# Patient Record
Sex: Male | Born: 1990 | Race: White | Hispanic: No | Marital: Single
Health system: Southern US, Community
[De-identification: ages and names within clinical notes are randomized; demographics above are authoritative.]

---

## 2018-03-29 DIAGNOSIS — M25461 Effusion, right knee: Secondary | ICD-10-CM | POA: Diagnosis not present

## 2018-03-29 DIAGNOSIS — L4050 Arthropathic psoriasis, unspecified: Secondary | ICD-10-CM | POA: Diagnosis not present

## 2018-03-29 DIAGNOSIS — Z6826 Body mass index (BMI) 26.0-26.9, adult: Secondary | ICD-10-CM | POA: Diagnosis not present

## 2018-03-29 DIAGNOSIS — Z1331 Encounter for screening for depression: Secondary | ICD-10-CM | POA: Diagnosis not present

## 2018-04-17 DIAGNOSIS — M25561 Pain in right knee: Secondary | ICD-10-CM | POA: Diagnosis not present

## 2018-04-17 DIAGNOSIS — L4050 Arthropathic psoriasis, unspecified: Secondary | ICD-10-CM | POA: Diagnosis not present

## 2018-04-17 DIAGNOSIS — R5383 Other fatigue: Secondary | ICD-10-CM | POA: Diagnosis not present

## 2018-04-17 DIAGNOSIS — M25541 Pain in joints of right hand: Secondary | ICD-10-CM | POA: Diagnosis not present

## 2018-04-17 DIAGNOSIS — Z79899 Other long term (current) drug therapy: Secondary | ICD-10-CM | POA: Diagnosis not present

## 2018-04-17 DIAGNOSIS — L409 Psoriasis, unspecified: Secondary | ICD-10-CM | POA: Diagnosis not present

## 2018-04-17 DIAGNOSIS — M545 Low back pain: Secondary | ICD-10-CM | POA: Diagnosis not present

## 2018-04-17 DIAGNOSIS — M2560 Stiffness of unspecified joint, not elsewhere classified: Secondary | ICD-10-CM | POA: Diagnosis not present

## 2018-04-17 DIAGNOSIS — M1990 Unspecified osteoarthritis, unspecified site: Secondary | ICD-10-CM | POA: Diagnosis not present

## 2018-10-16 DIAGNOSIS — M545 Low back pain: Secondary | ICD-10-CM | POA: Diagnosis not present

## 2018-10-16 DIAGNOSIS — Z79899 Other long term (current) drug therapy: Secondary | ICD-10-CM | POA: Diagnosis not present

## 2018-10-16 DIAGNOSIS — M25541 Pain in joints of right hand: Secondary | ICD-10-CM | POA: Diagnosis not present

## 2018-10-16 DIAGNOSIS — M2560 Stiffness of unspecified joint, not elsewhere classified: Secondary | ICD-10-CM | POA: Diagnosis not present

## 2018-10-16 DIAGNOSIS — L4050 Arthropathic psoriasis, unspecified: Secondary | ICD-10-CM | POA: Diagnosis not present

## 2018-10-16 DIAGNOSIS — M1990 Unspecified osteoarthritis, unspecified site: Secondary | ICD-10-CM | POA: Diagnosis not present

## 2018-10-16 DIAGNOSIS — M659 Synovitis and tenosynovitis, unspecified: Secondary | ICD-10-CM | POA: Diagnosis not present

## 2018-10-16 DIAGNOSIS — R5383 Other fatigue: Secondary | ICD-10-CM | POA: Diagnosis not present

## 2018-10-16 DIAGNOSIS — L409 Psoriasis, unspecified: Secondary | ICD-10-CM | POA: Diagnosis not present

## 2018-10-16 DIAGNOSIS — M25561 Pain in right knee: Secondary | ICD-10-CM | POA: Diagnosis not present

## 2018-11-12 ENCOUNTER — Encounter: Payer: Self-pay | Admitting: Pharmacist

## 2018-11-12 ENCOUNTER — Telehealth: Payer: Self-pay | Admitting: Pharmacist

## 2018-11-12 ENCOUNTER — Other Ambulatory Visit: Payer: Self-pay

## 2018-11-12 DIAGNOSIS — Z79899 Other long term (current) drug therapy: Secondary | ICD-10-CM

## 2018-11-12 MED ORDER — ADALIMUMAB 40 MG/0.8ML ~~LOC~~ AJKT: 40.0000 mg | AUTO-INJECTOR | SUBCUTANEOUS | 1 refills | Status: DC

## 2018-11-12 NOTE — Progress Notes (Signed)
  S: Patient presents for review of their specialty medication therapy.  Patient is currently taking Humira for psoriatic arthritis. Patient is managed by Dr. Hector Shade for this.   Adherence: denies any missed doses. Has had to stop in the past due to lack of coverage by insurance and concern of increased infection risk.   Efficacy: worked well for him in the past.  Dosing:  Psoriatic arthritis: SubQ: 40 mg every other week (may continue methotrexate, other nonbiologic DMARDS, corticosteroids, NSAIDs and/or analgesics)  Dose adjustments: Renal: no dose adjustments (has not been studied) Hepatic: no dose adjustments (has not been studied)  Screening: TB test: negative per patient Hepatitis: negative  Monitoring: S/sx of infection: denies CBC: see CareEverywhere S/sx of hypersensitivity: denies S/sx of malignancy: denies S/sx of heart failure: denies  Other side effects: denies  O:     No results found for: WBC, HGB, HCT, MCV, PLT    Chemistry   No results found for: NA, K, CL, CO2, BUN, CREATININE, GLU No results found for: CALCIUM, ALKPHOS, AST, ALT, BILITOT     A/P: 1. Medication review: Patient currently on Humira for psoriatic arthritis. Reviewed the medication with the patient, including the following: Humira is a TNF blocking agent indicated for ankylosing spondylitis, Crohn's disease, Hidradenitis suppurativa, psoriatic arthritis, plaque psoriasis, ulcerative colitis, and uveitis. Patient educated on purpose, proper use and potential adverse effects of Humira. Possible adverse effects are increased risk of infections, headache, and injection site reactions. There is the possibility of an increased risk of malignancy but it is not well understood if this increased risk is due to there medication or the disease state. There are rare cases of pancytopenia and aplastic anemia. For SubQ injection at separate sites in the thigh or lower abdomen (avoiding areas within 2 inches  of navel); rotate injection sites. May leave at room temperature for ~15 to 30 minutes prior to use; do not remove cap or cover while allowing product to reach room temperature. Do not use if solution is discolored or contains particulate matter. Do not administer to skin which is red, tender, bruised, hard, or that has scars, stretch marks, or psoriasis plaques. Needle cap of the prefilled syringe or needle cover for the adalimumab pen may contain latex. Prefilled pens and syringes are available for use by patients and the full amount of the syringe should be injected (self-administration); the vial is intended for institutional use only. Vials do not contain a preservative; discard unused portion. No recommendations for any changes at this time.  Alvino Blood, PharmD, BCPS, BCACP, CPP Clinical Pharmacist Practitioner  [phone]

## 2018-11-16 MED FILL — HUMIRA PEN 40 MG/0.8ML PNKT: 40 | 28 days supply | Qty: 2 | Fill #0

## 2019-07-24 MED FILL — HUMIRA PEN 40 MG/0.8ML PNKT: 40 | 28 days supply | Qty: 2 | Fill #1

## 2019-08-29 MED FILL — HUMIRA PEN 40 MG/0.8ML PNKT: 40 | 28 days supply | Qty: 2 | Fill #2

## 2019-12-04 ENCOUNTER — Telehealth: Payer: Self-pay | Admitting: Pharmacist

## 2019-12-04 NOTE — Telephone Encounter (Signed)
Called patient to schedule an appointment for the [facility]. I was unable to reach the patient so I left a HIPAA-compliant message requesting that the patient return my call.   

## 2020-01-04 ENCOUNTER — Emergency Department: Payer: PRIVATE HEALTH INSURANCE

## 2020-01-04 ENCOUNTER — Encounter: Payer: Self-pay | Admitting: Emergency Medicine

## 2020-01-04 ENCOUNTER — Other Ambulatory Visit: Payer: Self-pay

## 2020-01-04 ENCOUNTER — Emergency Department
Admission: EM | Admit: 2020-01-04 | Discharge: 2020-01-04 | Disposition: A | Discharge status: Home / Self Care | Payer: PRIVATE HEALTH INSURANCE | Attending: Emergency Medicine | Admitting: Emergency Medicine

## 2020-01-04 DIAGNOSIS — Y92238 Other place in hospital as the place of occurrence of the external cause: Secondary | ICD-10-CM | POA: Insufficient documentation

## 2020-01-04 DIAGNOSIS — Y9389 Activity, other specified: Secondary | ICD-10-CM | POA: Insufficient documentation

## 2020-01-04 DIAGNOSIS — Z79899 Other long term (current) drug therapy: Secondary | ICD-10-CM | POA: Insufficient documentation

## 2020-01-04 DIAGNOSIS — S0083XA Contusion of other part of head, initial encounter: Secondary | ICD-10-CM | POA: Insufficient documentation

## 2020-01-04 DIAGNOSIS — S0993XA Unspecified injury of face, initial encounter: Secondary | ICD-10-CM | POA: Diagnosis present

## 2020-01-04 DIAGNOSIS — S40022A Contusion of left upper arm, initial encounter: Secondary | ICD-10-CM | POA: Diagnosis not present

## 2020-01-04 DIAGNOSIS — Y990 Civilian activity done for income or pay: Secondary | ICD-10-CM | POA: Insufficient documentation
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
## 2020-03-14 DIAGNOSIS — F3289 Other specified depressive episodes: Secondary | ICD-10-CM | POA: Diagnosis not present

## 2020-03-14 DIAGNOSIS — F1010 Alcohol abuse, uncomplicated: Secondary | ICD-10-CM | POA: Diagnosis not present

## 2020-03-14 DIAGNOSIS — M25561 Pain in right knee: Secondary | ICD-10-CM | POA: Diagnosis not present

## 2020-03-14 DIAGNOSIS — G8929 Other chronic pain: Secondary | ICD-10-CM | POA: Diagnosis not present

## 2020-03-14 DIAGNOSIS — M25562 Pain in left knee: Secondary | ICD-10-CM | POA: Diagnosis not present

## 2020-03-21 DIAGNOSIS — M25579 Pain in unspecified ankle and joints of unspecified foot: Secondary | ICD-10-CM | POA: Diagnosis not present

## 2020-03-21 DIAGNOSIS — M79671 Pain in right foot: Secondary | ICD-10-CM | POA: Diagnosis not present

## 2020-03-26 DIAGNOSIS — M25541 Pain in joints of right hand: Secondary | ICD-10-CM | POA: Diagnosis not present

## 2020-03-26 DIAGNOSIS — M25462 Effusion, left knee: Secondary | ICD-10-CM | POA: Diagnosis not present

## 2020-03-26 DIAGNOSIS — M533 Sacrococcygeal disorders, not elsewhere classified: Secondary | ICD-10-CM | POA: Diagnosis not present

## 2020-03-26 DIAGNOSIS — M2560 Stiffness of unspecified joint, not elsewhere classified: Secondary | ICD-10-CM | POA: Diagnosis not present

## 2020-03-26 DIAGNOSIS — M25562 Pain in left knee: Secondary | ICD-10-CM | POA: Diagnosis not present

## 2020-03-26 DIAGNOSIS — M79642 Pain in left hand: Secondary | ICD-10-CM | POA: Diagnosis not present

## 2020-03-26 DIAGNOSIS — M1990 Unspecified osteoarthritis, unspecified site: Secondary | ICD-10-CM | POA: Diagnosis not present

## 2020-03-26 DIAGNOSIS — M25461 Effusion, right knee: Secondary | ICD-10-CM | POA: Diagnosis not present

## 2020-03-26 DIAGNOSIS — Z79899 Other long term (current) drug therapy: Secondary | ICD-10-CM | POA: Diagnosis not present

## 2020-03-26 DIAGNOSIS — M79641 Pain in right hand: Secondary | ICD-10-CM | POA: Diagnosis not present

## 2020-03-26 DIAGNOSIS — L409 Psoriasis, unspecified: Secondary | ICD-10-CM | POA: Diagnosis not present

## 2020-03-26 DIAGNOSIS — L4050 Arthropathic psoriasis, unspecified: Secondary | ICD-10-CM | POA: Diagnosis not present

## 2020-03-26 DIAGNOSIS — M545 Low back pain, unspecified: Secondary | ICD-10-CM | POA: Diagnosis not present

## 2020-03-26 DIAGNOSIS — R5383 Other fatigue: Secondary | ICD-10-CM | POA: Diagnosis not present

## 2020-03-26 DIAGNOSIS — M25561 Pain in right knee: Secondary | ICD-10-CM | POA: Diagnosis not present

## 2020-04-01 ENCOUNTER — Ambulatory Visit (HOSPITAL_BASED_OUTPATIENT_CLINIC_OR_DEPARTMENT_OTHER): Payer: Self-pay | Admitting: Pharmacist

## 2020-04-01 ENCOUNTER — Other Ambulatory Visit: Payer: Self-pay | Admitting: Internal Medicine

## 2020-04-01 ENCOUNTER — Other Ambulatory Visit: Payer: Self-pay

## 2020-04-01 DIAGNOSIS — Z79899 Other long term (current) drug therapy: Secondary | ICD-10-CM

## 2020-04-01 MED ORDER — HUMIRA PEN 40 MG/0.8ML ~~LOC~~ PNKT: PEN_INJECTOR | SUBCUTANEOUS | 2 refills | Status: DC

## 2020-04-01 NOTE — Progress Notes (Signed)
°  S: Patient presents for review of their specialty medication therapy.  Patient is currently taking Humira for psoriatic arthritis. Patient is managed by Dr. Hector Shade for this.   Adherence: has been off the medication for some time d/t concern of increased infection risk with his job. Also had some issues with refills with the pharmacy.     Efficacy: worked well for him in the past.  Dosing:  Psoriatic arthritis: SubQ: 40 mg every other week (may continue methotrexate, other nonbiologic DMARDS, corticosteroids, NSAIDs and/or analgesics)  Dose adjustments: Renal: no dose adjustments (has not been studied) Hepatic: no dose adjustments (has not been studied)  Screening: TB test: negative per patient Hepatitis: negative  Monitoring: S/sx of infection: denies CBC: see CareEverywhere S/sx of hypersensitivity: denies S/sx of malignancy: denies S/sx of heart failure: denies  Other side effects: denies  O:     No results found for: WBC, HGB, HCT, MCV, PLT    Chemistry   No results found for: NA, K, CL, CO2, BUN, CREATININE, GLU No results found for: CALCIUM, ALKPHOS, AST, ALT, BILITOT     A/P: 1. Medication review: Patient restarting Humira for psoriatic arthritis. Reviewed the medication with the patient, including the following: Humira is a TNF blocking agent indicated for ankylosing spondylitis, Crohn's disease, Hidradenitis suppurativa, psoriatic arthritis, plaque psoriasis, ulcerative colitis, and uveitis. Patient educated on purpose, proper use and potential adverse effects of Humira. Possible adverse effects are increased risk of infections, headache, and injection site reactions. There is the possibility of an increased risk of malignancy but it is not well understood if this increased risk is due to there medication or the disease state. There are rare cases of pancytopenia and aplastic anemia. For SubQ injection at separate sites in the thigh or lower abdomen (avoiding  areas within 2 inches of navel); rotate injection sites. May leave at room temperature for ~15 to 30 minutes prior to use; do not remove cap or cover while allowing product to reach room temperature. Do not use if solution is discolored or contains particulate matter. Do not administer to skin which is red, tender, bruised, hard, or that has scars, stretch marks, or psoriasis plaques. Needle cap of the prefilled syringe or needle cover for the adalimumab pen may contain latex. Prefilled pens and syringes are available for use by patients and the full amount of the syringe should be injected (self-administration); the vial is intended for institutional use only. Vials do not contain a preservative; discard unused portion. No recommendations for any changes at this time.  Butch Penny, PharmD, CPP Clinical Pharmacist [facility]: [facility] [phone]

## 2020-04-02 MED FILL — HUMIRA PEN 40 MG/0.8ML PNKT: 40 | 28 days supply | Qty: 2 | Fill #0

## 2020-04-15 DIAGNOSIS — Z23 Encounter for immunization: Secondary | ICD-10-CM | POA: Diagnosis not present

## 2020-04-15 DIAGNOSIS — M25561 Pain in right knee: Secondary | ICD-10-CM | POA: Diagnosis not present

## 2020-04-15 DIAGNOSIS — M545 Low back pain, unspecified: Secondary | ICD-10-CM | POA: Diagnosis not present

## 2020-04-15 DIAGNOSIS — Z79899 Other long term (current) drug therapy: Secondary | ICD-10-CM | POA: Diagnosis not present

## 2020-04-15 DIAGNOSIS — M25541 Pain in joints of right hand: Secondary | ICD-10-CM | POA: Diagnosis not present

## 2020-04-15 DIAGNOSIS — L409 Psoriasis, unspecified: Secondary | ICD-10-CM | POA: Diagnosis not present

## 2020-04-15 DIAGNOSIS — M1990 Unspecified osteoarthritis, unspecified site: Secondary | ICD-10-CM | POA: Diagnosis not present

## 2020-04-15 DIAGNOSIS — M2560 Stiffness of unspecified joint, not elsewhere classified: Secondary | ICD-10-CM | POA: Diagnosis not present

## 2020-04-15 DIAGNOSIS — R5383 Other fatigue: Secondary | ICD-10-CM | POA: Diagnosis not present

## 2020-04-15 DIAGNOSIS — L4050 Arthropathic psoriasis, unspecified: Secondary | ICD-10-CM | POA: Diagnosis not present

## 2020-04-22 DIAGNOSIS — D72825 Bandemia: Secondary | ICD-10-CM | POA: Diagnosis not present

## 2020-04-30 MED FILL — HUMIRA PEN 40 MG/0.8ML PNKT: 40 | 28 days supply | Qty: 2 | Fill #1

## 2020-05-29 DIAGNOSIS — M1990 Unspecified osteoarthritis, unspecified site: Secondary | ICD-10-CM | POA: Diagnosis not present

## 2020-05-29 DIAGNOSIS — L4050 Arthropathic psoriasis, unspecified: Secondary | ICD-10-CM | POA: Diagnosis not present

## 2020-05-29 DIAGNOSIS — M2560 Stiffness of unspecified joint, not elsewhere classified: Secondary | ICD-10-CM | POA: Diagnosis not present

## 2020-06-02 MED FILL — HUMIRA PEN 40 MG/0.8ML PNKT: 40 | 28 days supply | Qty: 2 | Fill #2

## 2020-06-30 ENCOUNTER — Other Ambulatory Visit: Payer: Self-pay | Admitting: Pharmacist

## 2020-06-30 MED ORDER — HUMIRA PEN 40 MG/0.8ML ~~LOC~~ PNKT: PEN_INJECTOR | SUBCUTANEOUS | 2 refills | Status: DC

## 2020-09-08 ENCOUNTER — Other Ambulatory Visit (HOSPITAL_COMMUNITY): Payer: Self-pay
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
